# Patient Record
Sex: Female | Born: 1960 | Race: White | Hispanic: No | Marital: Married | Smoking: Never smoker
Health system: Southern US, Community
[De-identification: ages and names within clinical notes are randomized; demographics above are authoritative.]

## PROBLEM LIST (undated history)

## (undated) DIAGNOSIS — J45909 Unspecified asthma, uncomplicated: Secondary | ICD-10-CM

## (undated) DIAGNOSIS — B4481 Allergic bronchopulmonary aspergillosis: Secondary | ICD-10-CM

---

## 2021-03-31 ENCOUNTER — Ambulatory Visit: Admit: 2021-03-31 | Payer: Self-pay

## 2021-03-31 ENCOUNTER — Other Ambulatory Visit: Payer: Self-pay

## 2021-03-31 ENCOUNTER — Encounter: Payer: Self-pay | Admitting: Emergency Medicine

## 2021-03-31 ENCOUNTER — Ambulatory Visit (INDEPENDENT_AMBULATORY_CARE_PROVIDER_SITE_OTHER): Payer: Medicare Other

## 2021-03-31 ENCOUNTER — Ambulatory Visit
Admission: EM | Admit: 2021-03-31 | Discharge: 2021-03-31 | Disposition: A | Discharge status: Home / Self Care | Payer: Medicare Other | Attending: Emergency Medicine | Admitting: Emergency Medicine

## 2021-03-31 DIAGNOSIS — R059 Cough, unspecified: Secondary | ICD-10-CM

## 2021-03-31 DIAGNOSIS — J189 Pneumonia, unspecified organism: Secondary | ICD-10-CM | POA: Diagnosis not present

## 2021-03-31 DIAGNOSIS — R509 Fever, unspecified: Secondary | ICD-10-CM

## 2021-03-31 DIAGNOSIS — R0602 Shortness of breath: Secondary | ICD-10-CM

## 2021-03-31 DIAGNOSIS — Z20822 Contact with and (suspected) exposure to covid-19: Secondary | ICD-10-CM | POA: Diagnosis not present

## 2021-03-31 DIAGNOSIS — J180 Bronchopneumonia, unspecified organism: Secondary | ICD-10-CM | POA: Diagnosis not present

## 2021-03-31 DIAGNOSIS — B4481 Allergic bronchopulmonary aspergillosis: Secondary | ICD-10-CM

## 2021-03-31 DIAGNOSIS — J45909 Unspecified asthma, uncomplicated: Secondary | ICD-10-CM

## 2021-03-31 HISTORY — DX: Unspecified asthma, uncomplicated: J45.909

## 2021-03-31 HISTORY — DX: Allergic bronchopulmonary aspergillosis: B44.81

## 2021-03-31 LAB — RAPID INFLUENZA A&B ANTIGENS
Influenza A (ARMC): NEGATIVE
Influenza B (ARMC): NEGATIVE

## 2021-03-31 MED ORDER — AZITHROMYCIN 250 MG PO TABS: 250.0000 mg | ORAL_TABLET | Freq: Every day | ORAL | 0 refills | Status: DC

## 2021-03-31 MED ORDER — ALBUTEROL SULFATE HFA 108 (90 BASE) MCG/ACT IN AERS: 1.0000 | INHALATION_SPRAY | Freq: Four times a day (QID) | RESPIRATORY_TRACT | 0 refills | Status: AC | PRN

## 2021-03-31 MED ORDER — AMOXICILLIN-POT CLAVULANATE 875-125 MG PO TABS: 1.0000 | ORAL_TABLET | Freq: Two times a day (BID) | ORAL | 0 refills | Status: AC

## 2021-03-31 MED ORDER — PROMETHAZINE-DM 6.25-15 MG/5ML PO SYRP: 5.0000 mL | ORAL_SOLUTION | Freq: Four times a day (QID) | ORAL | 0 refills | Status: DC | PRN

## 2021-03-31 MED ORDER — BENZONATATE 100 MG PO CAPS: 200.0000 mg | ORAL_CAPSULE | Freq: Three times a day (TID) | ORAL | 0 refills | Status: DC

## 2021-03-31 MED ORDER — PREDNISONE 20 MG PO TABS: 60.0000 mg | ORAL_TABLET | Freq: Every day | ORAL | 0 refills | Status: AC

## 2021-03-31 NOTE — Discharge Instructions (Addendum)
Your chest x-ray showed bilateral pneumonia today.  I will treat your pneumonia with 2 separate antibiotics, Augmentin and azithromycin.  Take the Augmentin twice daily with food for 10 days for treatment of your pneumonia.  Take the azithromycin 2 tablets today and then 1 tablet each day thereafter for a total of 5 days of treatment for your pneumonia.  Start the prednisone in the morning at breakfast time.  You will take 3 tablets (60 mg) daily for 5 days.  This will help with lung inflammation and aid in healing.  Use the albuterol inhaler, 1 to 2 puffs every 4-6 hours, as needed for shortness of breath and or wheezing.  Use the Tessalon Perles every 8 hours during the day as needed for cough.  They will call Dr. Cough reflex.  Take them with a small sip of water.  You may experience some numbness to the base of your tongue or metallic taste in her mouth, this is normal.  Use the Promethazine DM cough syrup at bedtime.  This has not antihistamine in it which will help dry you up but will also make you drowsy so it makes it easier for her to sleep.  If you have any worsening of your shortness of breath please return for reevaluation or go to the ER.

## 2021-03-31 NOTE — ED Provider Notes (Signed)
[facility]    CSN: [id number] Arrival date & time: 03/31/21  1126      History   Chief Complaint Chief Complaint  Patient presents with   Cough   Shortness of Breath   Fatigue    HPI Michelle Chang is a 60 y.o. female.   HPI  60 year old female here for evaluation of respiratory complaints.  Patient reports that she has been experiencing a productive cough for yellow-green sputum, fatigue, and shortness of breath for last 6 days.  This is been coupled with a fever with a T-max of 102 and a decreased appetite.  She denies runny nose, nasal congestion, ear pain or pressure, sore throat, or GI complaints.  She is not a smoker.  She is unaware of any sick contacts though she did attend a birthday party with one of her grandchildren the week and before her symptoms started.  Past Medical History:  Diagnosis Date   Allergic bronchopulmonary aspergillosis (HCC)    Asthma     Patient Active Problem List   Diagnosis Date Noted   Allergic bronchopulmonary aspergillosis (HCC) 03/31/2021   Asthma 03/31/2021    History reviewed. No pertinent surgical history.  OB History   No obstetric history on file.      Home Medications    Prior to Admission medications   Medication Sig Start Date End Date Taking? Authorizing Provider  amoxicillin-clavulanate (AUGMENTIN) 875-125 MG tablet Take 1 tablet by mouth every 12 (twelve) hours for 10 days. 03/31/21 04/10/21 Yes Becky Augusta, NP  azithromycin (ZITHROMAX Z-PAK) 250 MG tablet Take 1 tablet (250 mg total) by mouth daily. Take 2 tablets on the first day and then 1 tablet daily thereafter for a total of 5 days of treatment. 03/31/21  Yes Becky Augusta, NP  benzonatate (TESSALON) 100 MG capsule Take 2 capsules (200 mg total) by mouth every 8 (eight) hours. 03/31/21  Yes Becky Augusta, NP  predniSONE (DELTASONE) 20 MG tablet Take 3 tablets (60 mg total) by mouth daily with breakfast for 5 days. 3 tablets daily for 5 days.  03/31/21 04/05/21 Yes Becky Augusta, NP  promethazine-dextromethorphan (PROMETHAZINE-DM) 6.25-15 MG/5ML syrup Take 5 mLs by mouth 4 (four) times daily as needed. 03/31/21  Yes Becky Augusta, NP  albuterol (VENTOLIN HFA) 108 (90 Base) MCG/ACT inhaler Inhale 1-2 puffs into the lungs every 6 (six) hours as needed for wheezing or shortness of breath. 03/31/21   Becky Augusta, NP  buPROPion (WELLBUTRIN XL) 300 MG 24 hr tablet Take by mouth.    [provider]  clonazePAM (KLONOPIN) 0.5 MG tablet Take by mouth.    [provider]    Family History History reviewed. No pertinent family history.  Social History Social History   Tobacco Use   Smoking status: Never   Smokeless tobacco: Never  Substance Use Topics   Alcohol use: Not Currently   Drug use: Not Currently     Allergies   Morphine, Molds & smuts, Gluten meal, and Milk protein   Review of Systems Review of Systems  Constitutional:  Positive for appetite change, fatigue and fever. Negative for activity change.  HENT:  Negative for congestion, ear pain, rhinorrhea and sore throat.   Respiratory:  Positive for cough and shortness of breath. Negative for wheezing.   Gastrointestinal:  Negative for diarrhea, nausea and vomiting.  Skin:  Negative for rash.  Hematological: Negative.   Psychiatric/Behavioral: Negative.      Physical Exam Triage Vital Signs ED Triage Vitals  Enc  Vitals Group     BP 03/31/21 1350 (!) 121/58     Pulse Rate 03/31/21 1350 92     Resp 03/31/21 1350 20     Temp 03/31/21 1350 100 F (37.8 C)     Temp Source 03/31/21 1350 Oral     SpO2 03/31/21 1350 97 %     Weight 03/31/21 1346 130 lb (59 kg)     Height 03/31/21 1346 5\' 5"  (1.651 m)     Head Circumference --      Peak Flow --      Pain Score 03/31/21 1346 0     Pain Loc --      Pain Edu? --      Excl. in [place]? --    No data found.  Updated Vital Signs BP (!) 121/58 (BP Location: Left Arm)   Pulse 92   Temp 100 F (37.8 C)  (Oral)   Resp 20   Ht 5\' 5"  (1.651 m)   Wt 130 lb (59 kg)   SpO2 97%   BMI 21.63 kg/m   Visual Acuity Right Eye Distance:   Left Eye Distance:   Bilateral Distance:    Right Eye Near:   Left Eye Near:    Bilateral Near:     Physical Exam Vitals and nursing note reviewed.  Constitutional:      General: She is not in acute distress.    Appearance: Normal appearance. She is not ill-appearing.  HENT:     Head: Normocephalic and atraumatic.     Right Ear: Tympanic membrane, ear canal and external ear normal. There is no impacted cerumen.     Left Ear: Tympanic membrane, ear canal and external ear normal. There is no impacted cerumen.     Nose: Nose normal. No congestion or rhinorrhea.     Mouth/Throat:     Mouth: Mucous membranes are moist.     Pharynx: Oropharynx is clear. No posterior oropharyngeal erythema.  Cardiovascular:     Rate and Rhythm: Normal rate and regular rhythm.     Pulses: Normal pulses.     Heart sounds: Normal heart sounds. No murmur heard.   No gallop.  Pulmonary:     Effort: Pulmonary effort is normal.     Breath sounds: Normal breath sounds. No wheezing, rhonchi or rales.  Musculoskeletal:     Cervical back: Normal range of motion and neck supple.  Lymphadenopathy:     Cervical: No cervical adenopathy.  Skin:    General: Skin is warm and dry.     Capillary Refill: Capillary refill takes less than 2 seconds.     Findings: No erythema or rash.  Neurological:     General: No focal deficit present.     Mental Status: She is alert and oriented to person, place, and time.  Psychiatric:        Mood and Affect: Mood normal.        Behavior: Behavior normal.        Thought Content: Thought content normal.        Judgment: Judgment normal.     UC Treatments / Results  Labs (all labs ordered are listed, but only abnormal results are displayed) Labs Reviewed  RAPID INFLUENZA A&B ANTIGENS  SARS CORONAVIRUS 2 (TAT 6-24 HRS)     EKG   Radiology DG Chest 2 View  Result Date: 03/31/2021 CLINICAL DATA:  Cough, fever and shortness of breath over the last 6 days. EXAM: CHEST - 2 VIEW  COMPARISON:  None. FINDINGS: Heart size is normal. Mediastinal shadows are normal. There are abnormal patchy parenchymal densities in the mid and lower lungs bilaterally, consistent with patchy bronchopneumonia. No lobar consolidation or collapse. No effusion. No significant bone finding. IMPRESSION: Patchy bilateral bronchopneumonia, without lobar consolidation or collapse. Follow-up to clearing is recommended. Electronically Signed   By: Paulina Fusi M.D.   On: 03/31/2021 14:40    Procedures Procedures (including critical care time)  Medications Ordered in UC Medications - No data to display  Initial Impression / Assessment and Plan / UC Course  I have reviewed the triage vital signs and the nursing notes.  Pertinent labs & imaging results that were available during my care of the patient were reviewed by me and considered in my medical decision making (see chart for details).  Patient is a nontoxic-appearing 60 year old female here for evaluation of lower respiratory complaints as outlined in HPI above.  Patient has no upper respiratory complaints and her upper respiratory exam is benign.  Cardiopulmonary exam reveals decreased lung sounds in the middle and lower aspects bilaterally.  No wheezes, rhonchi, or rales appreciated on exam.  Will obtain chest radiograph to evaluate for possible pneumonia given the purulent sputum patient is expectorating.  Chest x-ray independently reviewed and evaluated by me.  Impression: There is a patchy infiltrate in the right middle lobe and the left hilar area.  Radiology overread is pending. Radiology impression is patchy bilateral bronchopneumonia without lobar consolidation or collapse.  Will treat patient for commune acquired pneumonia with Augmentin and azithromycin.  We will also give  prednisone to help with bronchial inflammation and albuterol inhaler to help with shortness of breath.  Tessalon Perles provided to help with cough as well as some Promethazine DM cough syrup for bedtime.   Final Clinical Impressions(s) / UC Diagnoses   Final diagnoses:  Community acquired pneumonia, unspecified laterality     Discharge Instructions      Your chest x-ray showed bilateral pneumonia today.  I will treat your pneumonia with 2 separate antibiotics, Augmentin and azithromycin.  Take the Augmentin twice daily with food for 10 days for treatment of your pneumonia.  Take the azithromycin 2 tablets today and then 1 tablet each day thereafter for a total of 5 days of treatment for your pneumonia.  Start the prednisone in the morning at breakfast time.  You will take 3 tablets (60 mg) daily for 5 days.  This will help with lung inflammation and aid in healing.  Use the albuterol inhaler, 1 to 2 puffs every 4-6 hours, as needed for shortness of breath and or wheezing.  Use the Tessalon Perles every 8 hours during the day as needed for cough.  They will call Dr. Cough reflex.  Take them with a small sip of water.  You may experience some numbness to the base of your tongue or metallic taste in her mouth, this is normal.  Use the Promethazine DM cough syrup at bedtime.  This has not antihistamine in it which will help dry you up but will also make you drowsy so it makes it easier for her to sleep.  If you have any worsening of your shortness of breath please return for reevaluation or go to the ER.       ED Prescriptions     Medication Sig Dispense Auth. Provider   albuterol (VENTOLIN HFA) 108 (90 Base) MCG/ACT inhaler Inhale 1-2 puffs into the lungs every 6 (six) hours as needed for wheezing  or shortness of breath. 18 g Becky Augusta, NP   amoxicillin-clavulanate (AUGMENTIN) 875-125 MG tablet Take 1 tablet by mouth every 12 (twelve) hours for 10 days. 20 tablet Becky Augusta, NP   azithromycin (ZITHROMAX Z-PAK) 250 MG tablet Take 1 tablet (250 mg total) by mouth daily. Take 2 tablets on the first day and then 1 tablet daily thereafter for a total of 5 days of treatment. 6 tablet Becky Augusta, NP   benzonatate (TESSALON) 100 MG capsule Take 2 capsules (200 mg total) by mouth every 8 (eight) hours. 21 capsule Becky Augusta, NP   predniSONE (DELTASONE) 20 MG tablet Take 3 tablets (60 mg total) by mouth daily with breakfast for 5 days. 3 tablets daily for 5 days. 15 tablet Becky Augusta, NP   promethazine-dextromethorphan (PROMETHAZINE-DM) 6.25-15 MG/5ML syrup Take 5 mLs by mouth 4 (four) times daily as needed. 118 mL Becky Augusta, NP      PDMP not reviewed this encounter.   Becky Augusta, NP 03/31/21 1455

## 2021-03-31 NOTE — ED Triage Notes (Signed)
Pt presents today with c/o of SOB, cough and fatigue x 6 days. She reports running a fever of 102., yesterday 101.0. No OTC meds today.She called Pulmonologist who suggested she be seen for chest xray and testing of Covid and Flu today.

## 2021-04-01 LAB — SARS CORONAVIRUS 2 (TAT 6-24 HRS): SARS Coronavirus 2: NEGATIVE

## 2021-06-23 ENCOUNTER — Ambulatory Visit
Admission: RE | Admit: 2021-06-23 | Discharge: 2021-06-23 | Disposition: A | Discharge status: Home / Self Care | Payer: Medicare PPO | Source: Ambulatory Visit | Attending: Family | Admitting: Family

## 2021-06-23 ENCOUNTER — Other Ambulatory Visit: Payer: Self-pay

## 2021-06-23 ENCOUNTER — Ambulatory Visit (INDEPENDENT_AMBULATORY_CARE_PROVIDER_SITE_OTHER): Payer: Medicare PPO

## 2021-06-23 VITALS — BP 122/73 | HR 70 | Temp 98.6°F | Resp 18 | Ht 65.0 in | Wt 120.0 lb

## 2021-06-23 DIAGNOSIS — R319 Hematuria, unspecified: Secondary | ICD-10-CM | POA: Diagnosis not present

## 2021-06-23 LAB — URINALYSIS, COMPLETE (UACMP) WITH MICROSCOPIC
Bilirubin Urine: NEGATIVE
Glucose, UA: NEGATIVE mg/dL
Leukocytes,Ua: NEGATIVE
Nitrite: NEGATIVE
Protein, ur: NEGATIVE mg/dL
Specific Gravity, Urine: 1.025 (ref 1.005–1.030)
pH: 6 (ref 5.0–8.0)

## 2021-06-23 NOTE — ED Triage Notes (Signed)
Pt here with C/O blood in urine for the past 3 days.

## 2021-06-23 NOTE — ED Provider Notes (Signed)
[facility]    CSN: [id number] Arrival date & time: 06/23/21  1649      History   Chief Complaint Chief Complaint  Patient presents with   Hematuria    HPI Michelle Chang is a 61 y.o. female.   HPI  61 year old female here for evaluation of hematuria.  Patient reports that she first noticed a little bit of pink in her panties 3 days ago.  She noticed more yesterday and also today.  She denies any frank hematuria, clots, pain with urination, urinary urgency or frequency, fever, low back pain, or smoking history.  She does have a history of renal stones.  She did have 1 incident of renal stone being preceded by a finding of hematuria on routine urinalysis.  Past Medical History:  Diagnosis Date   Allergic bronchopulmonary aspergillosis ([place])    Asthma     Patient Active Problem List   Diagnosis Date Noted   Allergic bronchopulmonary aspergillosis ([place]) 03/31/2021   Asthma 03/31/2021    History reviewed. No pertinent surgical history.  OB History   No obstetric history on file.      Home Medications    Prior to Admission medications   Medication Sig Start Date End Date Taking? Authorizing Provider  albuterol (VENTOLIN HFA) 108 (90 Base) MCG/ACT inhaler Inhale 1-2 puffs into the lungs every 6 (six) hours as needed for wheezing or shortness of breath. 03/31/21  Yes Margarette Canada, NP  buPROPion (WELLBUTRIN XL) 300 MG 24 hr tablet Take by mouth.   Yes [provider]  clonazePAM (KLONOPIN) 0.5 MG tablet Take by mouth.   Yes [provider]  Fluticasone-Umeclidin-Vilant (TRELEGY ELLIPTA) 200-62.5-25 MCG/ACT AEPB Trelegy Ellipta 200 mcg-62.5 mcg-25 mcg powder for inhalation  Inhale 1 puff every day by inhalation route.   Yes [provider]  predniSONE (DELTASONE) 10 MG tablet Take 1 tablet by mouth daily. 06/16/21  Yes [provider]    Family History History reviewed. No pertinent family history.  Social History Social  History   Tobacco Use   Smoking status: Never   Smokeless tobacco: Never  Substance Use Topics   Alcohol use: Not Currently   Drug use: Not Currently     Allergies   Morphine, Molds & smuts, Gluten meal, and Milk protein   Review of Systems Review of Systems  Constitutional:  Negative for activity change and fever.  Gastrointestinal:  Negative for abdominal pain.  Genitourinary:  Positive for hematuria. Negative for dysuria, flank pain, frequency and urgency.  Musculoskeletal:  Negative for back pain.  Hematological: Negative.   Psychiatric/Behavioral: Negative.      Physical Exam Triage Vital Signs ED Triage Vitals  Enc Vitals Group     BP 06/23/21 1709 122/73     Pulse Rate 06/23/21 1709 70     Resp 06/23/21 1709 18     Temp 06/23/21 1709 98.6 F (37 C)     Temp Source 06/23/21 1709 Oral     SpO2 06/23/21 1709 98 %     Weight 06/23/21 1707 120 lb (54.4 kg)     Height 06/23/21 1707 5\' 5"  (1.651 m)     Head Circumference --      Peak Flow --      Pain Score 06/23/21 1706 0     Pain Loc --      Pain Edu? --      Excl. in [place]? --    No data found.  Updated Vital Signs BP 122/73 (  BP Location: Right Arm)    Pulse 70    Temp 98.6 F (37 C) (Oral)    Resp 18    Ht 5\' 5"  (1.651 m)    Wt 120 lb (54.4 kg)    SpO2 98%    BMI 19.97 kg/m   Visual Acuity Right Eye Distance:   Left Eye Distance:   Bilateral Distance:    Right Eye Near:   Left Eye Near:    Bilateral Near:     Physical Exam Vitals and nursing note reviewed.  Constitutional:      General: She is not in acute distress.    Appearance: Normal appearance. She is not ill-appearing.  HENT:     Head: Normocephalic and atraumatic.  Cardiovascular:     Rate and Rhythm: Normal rate and regular rhythm.     Pulses: Normal pulses.     Heart sounds: Normal heart sounds. No murmur heard.   No friction rub. No gallop.  Pulmonary:     Effort: Pulmonary effort is normal.     Breath sounds: Normal breath sounds.  No wheezing, rhonchi or rales.  Abdominal:     Tenderness: There is no right CVA tenderness or left CVA tenderness.  Skin:    General: Skin is warm and dry.     Capillary Refill: Capillary refill takes less than 2 seconds.     Findings: No erythema or rash.  Neurological:     General: No focal deficit present.     Mental Status: She is alert and oriented to person, place, and time.  Psychiatric:        Mood and Affect: Mood normal.        Behavior: Behavior normal.        Thought Content: Thought content normal.        Judgment: Judgment normal.     UC Treatments / Results  Labs (all labs ordered are listed, but only abnormal results are displayed) Labs Reviewed  URINALYSIS, COMPLETE (UACMP) WITH MICROSCOPIC - Abnormal; Notable for the following components:      Result Value   Hgb urine dipstick MODERATE (*)    Ketones, ur TRACE (*)    Bacteria, UA FEW (*)    All other components within normal limits    EKG   Radiology DG Abdomen 1 View  Result Date: 06/23/2021 CLINICAL DATA:  Hematuria.  History of renal stones. EXAM: ABDOMEN - 1 VIEW COMPARISON:  None. FINDINGS: No convincing stones project over the renal beds, evaluation is limited due to stool partially obscuring assessment. There is a calcification in the low left pelvis that is typically a phlebolith. No other calcifications along the course of the ureters. Moderate colonic stool burden without evidence of obstruction. Calcified granuloma the left lung base, incidental. The lung bases are otherwise clear. No acute osseous findings. IMPRESSION: 1. No evidence of urolithiasis, with limitations due to stool partially obscuring assessment of the renal beds. 2. A calcification in the low left pelvis is typically a phlebolith. Electronically Signed   By: Keith Rake M.D.   On: 06/23/2021 18:03    Procedures Procedures (including critical care time)  Medications Ordered in UC Medications - No data to display  Initial  Impression / Assessment and Plan / UC Course  I have reviewed the triage vital signs and the nursing notes.  Pertinent labs & imaging results that were available during my care of the patient were reviewed by me and considered in my medical decision making (  see chart for details).  Patient is a nontoxic-appearing 61 year old female here for evaluation of 3 days worth of hematuria without any other associated urinary symptoms.  On physical exam patient has a benign cardiopulmonary exam with clear lung sounds in all fields.  No CVA tenderness on exam.  Patient does have a history of renal stones and her previous presentation included findings of microscopic hematuria on UA a month or so prior to presenting with a stone.  She denies any flank pain, dysuria, urgency, or frequency.  She states that several weeks ago she did feel achy in her back but was able to get sleep.  She was never woke up out of sleep and the achiness resolved on its own.  Hematuria began 3 days ago with the finding of light pink in her panties.  She reports that this is still the finding and she has not seen any gross blood in her urine and she is not passing any clots.  We will check UA and also KUB to look for presence of possible infection or renal stone.  KUB independently reviewed and evaluated by me.  Impression: Patient has a significant stool burden throughout her colon.  No clear evidence of kidney stone noted on exam.  Radiology overread is pending. Radiology impression is no convincing stones project over the renal beds but evaluation is limited due to stool partially obscuring assessment.  There is calcification in the lower left pelvis that is typically a phlebolith.  No calcifications along the course the ureters.  Moderate colonic stool burden without evidence of obstruction.  Urinalysis shows moderate hemoglobin, trace ketones, 6-10 squamous epithelials, and few bacteria.  No leukocyte esterase, nitrites, or protein.  No  WBCs seen.  There is no evidence of infection on urinalysis and there is no evidence of renal stone on KUB.  Etiology of patient's hematuria is unclear.  As patient does not have a PCP secondary to recent relocation of this area will refer to urology for further evaluation.   Final Clinical Impressions(s) / UC Diagnoses   Final diagnoses:  Hematuria, unspecified type     Discharge Instructions      There is no evidence of urinary tract infection but she did have a moderate amount of hemoglobin in your urine.  Your x-ray did not show the presence of any renal stones either though part of the exam was obscured by stool.  I am going to refer you to [facility] urology at their [facility] office for further evaluation of your hematuria since you do not have a local PCP.  Please return for reevaluation for new or worsening symptoms.     ED Prescriptions   None    PDMP not reviewed this encounter.   Margarette Canada, NP 06/23/21 [phone]

## 2021-06-23 NOTE — Discharge Instructions (Addendum)
There is no evidence of urinary tract infection but she did have a moderate amount of hemoglobin in your urine.  Your x-ray did not show the presence of any renal stones either though part of the exam was obscured by stool.  I am going to refer you to [facility] urology at their [facility] office for further evaluation of your hematuria since you do not have a local PCP.  Please return for reevaluation for new or worsening symptoms.

## 2021-07-10 NOTE — Progress Notes (Addendum)
07/13/21 11:12 AM   Konrad Saha November 09, 1960 [id number]  Referring provider:  Margarette Canada, NP [address] [place],  [place] [address] Chief Complaint  Patient presents with   Hematuria     HPI: Michelle Chang is a 61 y.o.female who presents today for further evaluation of gross hematuria.   She was seen in th ED on 06/23/2021 for gross hematuria (pink urine including in underwear).  She did have some mild associated RLQ pain which as since resolved, non severe.  Urinalysis showed moderate Hgb, trace ketones, and few bacteria. KUB visualized no evidence of urolithiasis. It showed stool obscuring assessment of renal beds and a calcification in the low left pelvis. She was further referred to urology.   Urinalysis on 06/23/2021 does fact show 6-10 red blood cells per high-power field.  She does report a personal history of kidney stones a few years ago in [place].  She reports that she had a very small stone in her ureter but never saw passed.  She reports that before moving here in October, she had microscopic blood in her urine identified by her PCP was supposed to be evaluated for this however ended up moving to [place].  She does have a strong family history of renal cell carcinoma including 2 and 6 who died of this.  She denies any flank pain today.  She reports that she has had a very tough time since moving to [place] and may end up moving back to [place], her wife is in the process of moving back now.  There is a lot of social issues contributing to this.  Never smoker.   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  Home Medications:  Allergies as of 07/11/2021       Reactions   Morphine Hives   Molds & Smuts    Other reaction(s): Chest Pain   Gluten Meal    Milk Protein         Medication List        Accurate as of  July 11, 2021 11:59 PM. If you have any questions, ask your nurse or doctor.          albuterol 108 (90 Base) MCG/ACT inhaler Commonly known as: VENTOLIN HFA Inhale 1-2 puffs into the lungs every 6 (six) hours as needed for wheezing or shortness of breath.   Albuterol Sulfate (sensor) 108 (90 Base) MCG/ACT Aepb albuterol sulf 90 mcg/actuation breath activated powder inhaler,sensor   ALPRAZolam 0.5 MG tablet Commonly known as: XANAX alprazolam 0.5 mg tablet  TAKE 1 TABLET BY MOUTH 4 TIMES DAILY PRN   buPROPion 300 MG 24 hr tablet Commonly known as: WELLBUTRIN XL Take by mouth.   clonazePAM 0.5 MG tablet Commonly known as: KLONOPIN Take by mouth.   prednisoLONE 5 MG Tabs tablet Take 5 mg by mouth daily.   predniSONE 10 MG tablet Commonly known as: DELTASONE Take 1 tablet by mouth as needed (as needed for flare).   Trelegy Ellipta 200-62.5-25 MCG/ACT Aepb Generic drug: Fluticasone-Umeclidin-Vilant Trelegy Ellipta 200 mcg-62.5 mcg-25 mcg powder for inhalation  Inhale 1 puff every day by inhalation route.        Allergies:  Allergies  Allergen Reactions   Morphine Hives   Molds & Smuts     Other reaction(s): Chest Pain   Gluten Meal    Milk Protein  Family History: No family history on file.  Social History:  reports that she has never smoked. She has never used smokeless tobacco. She reports that she does not currently use alcohol. She reports that she does not currently use drugs.   Physical Exam: BP 99/62 (BP Location: Left Arm, Patient Position: Sitting, Cuff Size: Normal)    Pulse 83    Ht 5\' 5"  (1.651 m)    Wt 129 lb (58.5 kg)    BMI 21.47 kg/m   Constitutional:  Alert and oriented, No acute distress. HEENT: [place] AT, moist mucus membranes.  Trachea midline, no masses. Cardiovascular: No clubbing, cyanosis, or edema. Respiratory: Normal respiratory effort, no increased work of breathing. Skin: No rashes, bruises or suspicious lesions. Neurologic:  Grossly intact, no focal deficits, moving all 4 extremities. Psychiatric: Normal mood and affect.   Urinalysis Results for orders placed or performed during the hospital encounter of 07/11/21  Urinalysis, Complete w Microscopic  Result Value Ref Range   Color, Urine YELLOW YELLOW   APPearance CLEAR CLEAR   Specific Gravity, Urine 1.010 1.005 - 1.030   pH 6.5 5.0 - 8.0   Glucose, UA NEGATIVE NEGATIVE mg/dL   Hgb urine dipstick TRACE (A) NEGATIVE   Bilirubin Urine NEGATIVE NEGATIVE   Ketones, ur NEGATIVE NEGATIVE mg/dL   Protein, ur NEGATIVE NEGATIVE mg/dL   Nitrite NEGATIVE NEGATIVE   Leukocytes,Ua NEGATIVE NEGATIVE   Squamous Epithelial / LPF 0-5 0 - 5   WBC, UA 6-10 0 - 5 WBC/hpf   RBC / HPF 0-5 0 - 5 RBC/hpf   Bacteria, UA FEW (A) NONE SEEN     Pertinent Imaging: CLINICAL DATA:  Hematuria.  History of renal stones.   EXAM: ABDOMEN - 1 VIEW   COMPARISON:  None.   FINDINGS: No convincing stones project over the renal beds, evaluation is limited due to stool partially obscuring assessment. There is a calcification in the low left pelvis that is typically a phlebolith. No other calcifications along the course of the ureters. Moderate colonic stool burden without evidence of obstruction. Calcified granuloma the left lung base, incidental. The lung bases are otherwise clear. No acute osseous findings.   IMPRESSION: 1. No evidence of urolithiasis, with limitations due to stool partially obscuring assessment of the renal beds. 2. A calcification in the low left pelvis is typically a phlebolith.     Electronically Signed   By: Keith Rake M.D.   On: 06/23/2021 18:03  KUB was personally reviewed, no obvious stone burden.  Assessment & Plan:    1. Gross hematuria Based on her history of ongoing persistent microscopic as well as more recent episode of gross hematuria, I do think it is prudent to proceed with full hematuria evaluation including CT urogram and  cystoscopy at this point in time.  She also has strong family history of RCC.  We discussed the risk and benefits.  She is agreeable with proceeding. - CT HEMATURIA WORKUP; Future  2. History of kidney stones We will evaluate with the above, not appreciated on KUB  Follow-up cystoscopy/CT urogram  Conley Rolls as a scribe for Hollice Espy, MD.,have documented all relevant documentation on the behalf of Hollice Espy, MD,as directed by  Hollice Espy, MD while in the presence of Hollice Espy, MD.  I have reviewed the above documentation for accuracy and completeness, and I agree with the above.   Hollice Espy, MD   [facility] [address] [place], [place] [address] [phone]  [phone]  I spent 46 total minutes on the day of the encounter including pre-visit review of the medical record, face-to-face time with the patient, and post visit ordering of labs/imaging/tests.  Records from care everywhere [place] also reviewed.

## 2021-07-11 ENCOUNTER — Other Ambulatory Visit: Payer: Self-pay

## 2021-07-11 ENCOUNTER — Encounter: Payer: Self-pay | Admitting: Urology

## 2021-07-11 ENCOUNTER — Other Ambulatory Visit
Admission: RE | Admit: 2021-07-11 | Discharge: 2021-07-11 | Disposition: A | Discharge status: Home / Self Care | Payer: Medicare PPO | Attending: Urology | Admitting: Urology

## 2021-07-11 ENCOUNTER — Ambulatory Visit: Payer: Medicare PPO | Admitting: Urology

## 2021-07-11 ENCOUNTER — Other Ambulatory Visit: Payer: Self-pay | Admitting: *Deleted

## 2021-07-11 VITALS — BP 99/62 | HR 83 | Ht 65.0 in | Wt 129.0 lb

## 2021-07-11 DIAGNOSIS — R310 Gross hematuria: Secondary | ICD-10-CM

## 2021-07-11 DIAGNOSIS — Z87442 Personal history of urinary calculi: Secondary | ICD-10-CM | POA: Diagnosis not present

## 2021-07-11 LAB — URINALYSIS, COMPLETE (UACMP) WITH MICROSCOPIC
Bilirubin Urine: NEGATIVE
Glucose, UA: NEGATIVE mg/dL
Ketones, ur: NEGATIVE mg/dL
Leukocytes,Ua: NEGATIVE
Nitrite: NEGATIVE
Protein, ur: NEGATIVE mg/dL
Specific Gravity, Urine: 1.01 (ref 1.005–1.030)
pH: 6.5 (ref 5.0–8.0)

## 2021-07-25 ENCOUNTER — Other Ambulatory Visit: Payer: Medicare PPO | Admitting: Urology

## 2021-08-04 ENCOUNTER — Other Ambulatory Visit: Payer: Self-pay

## 2021-08-04 ENCOUNTER — Ambulatory Visit
Admission: RE | Admit: 2021-08-04 | Discharge: 2021-08-04 | Disposition: A | Discharge status: Home / Self Care | Payer: Medicare PPO | Source: Ambulatory Visit | Attending: Urology | Admitting: Urology

## 2021-08-04 DIAGNOSIS — R310 Gross hematuria: Secondary | ICD-10-CM | POA: Diagnosis present

## 2021-08-04 MED ORDER — IOHEXOL 300 MG/ML  SOLN
100.0000 mL | Freq: Once | INTRAMUSCULAR | Status: AC | PRN
Start: 2021-08-04 — End: 2021-08-04
  Administered 2021-08-04: 100 mL via INTRAVENOUS

## 2021-08-07 NOTE — Progress Notes (Signed)
? ?  08/08/21 ? ?CC:  ?Chief Complaint  ?Patient presents with  ? Cysto  ? ? ? ?HPI: ?Michelle Chang is a 61 y.o.female with a personal history of gross hematuria and kidney stones, who presents today for a diagnostic cystoscopy.  ? ?She was seen in th ED on 06/23/2021 for gross hematuria (pink urine including in underwear).  She did have some mild associated RLQ pain which as since resolved, non severe.  Urinalysis showed moderate Hgb, trace ketones, and few bacteria. KUB visualized no evidence of urolithiasis. It showed stool obscuring assessment of renal beds and a calcification in the low left pelvis. She was further referred to urology.  ?  ?Urinalysis on 06/23/2021 does fact show 6-10 red blood cells per high-power field.  ? ?She does have a strong family history of renal cell carcinoma including 2 and 6 who died of this.  ? ?She underwent a CT urogram on 08/04/2021 that was personally reviewed today and visualized no hydronephrosis.  No renal, ureteral or bladder calculi. No solid enhancing renal mass. Lingular and right middle lobe mucoid impaction with tree-in-bud opacities and 5 mm pulmonary nodule in the right middle lobe, most consistent with an infectious or inflammatory etiology including non TB Mycobacterium ? ?Urinalysis today was negative for hematuria.  ? ?Vitals:  ? 08/08/21 1524  ?BP: 115/69  ?Pulse: 66  ?NED. A&Ox3.   ?No respiratory distress   ?Abd soft, NT, ND ?Normal external genitalia with patent urethral meatus ? ?Cystoscopy Procedure Note ? ?Patient identification was confirmed, informed consent was obtained, and patient was prepped using Betadine solution.  Lidocaine jelly was administered per urethral meatus.   ? ?Procedure: ?- Flexible cystoscope introduced, without any difficulty.   ?- Thorough search of the bladder revealed: ?   normal urethral meatus ?   normal urothelium ?   no stones ?   no ulcers  ?   no tumors ?   no urethral polyps ?   no trabeculation ? ?- Ureteral orifices were  normal in position and appearance. ? ?Post-Procedure: ?- Patient tolerated the procedure well ? ?Assessment/ Plan: ?Gross Hematuria  ?- Cystoscopy unremarkable today  ?- UA today negative for hematuria ?- Discussed with her that if she has increased hematuria that she should follow-up or in 2 years if microscopic hematuria  persists ? ?2. Pulmonary nodule  ?- Advise her to follow-up with her pulmonologist, known issue and has appt in April ? ? ?I,Kailey Littlejohn,acting as a Education administrator for Hollice Espy, MD.,have documented all relevant documentation on the behalf of Hollice Espy, MD,as directed by  Hollice Espy, MD while in the presence of Hollice Espy, MD. ? ?I have reviewed the above documentation for accuracy and completeness, and I agree with the above.  ? ?Hollice Espy, MD ? ?

## 2021-08-08 ENCOUNTER — Other Ambulatory Visit: Payer: Self-pay

## 2021-08-08 ENCOUNTER — Other Ambulatory Visit
Admission: RE | Admit: 2021-08-08 | Discharge: 2021-08-08 | Disposition: A | Discharge status: Home / Self Care | Payer: Medicare PPO | Attending: Urology | Admitting: Urology

## 2021-08-08 ENCOUNTER — Encounter: Payer: Self-pay | Admitting: Urology

## 2021-08-08 ENCOUNTER — Ambulatory Visit: Payer: Medicare PPO | Admitting: Urology

## 2021-08-08 ENCOUNTER — Other Ambulatory Visit: Payer: Medicare PPO | Admitting: Urology

## 2021-08-08 ENCOUNTER — Other Ambulatory Visit: Payer: Self-pay | Admitting: *Deleted

## 2021-08-08 VITALS — BP 115/69 | HR 66 | Ht 65.0 in | Wt 129.0 lb

## 2021-08-08 DIAGNOSIS — R310 Gross hematuria: Secondary | ICD-10-CM

## 2021-08-08 LAB — URINALYSIS, COMPLETE (UACMP) WITH MICROSCOPIC
Bilirubin Urine: NEGATIVE
Glucose, UA: NEGATIVE mg/dL
Ketones, ur: NEGATIVE mg/dL
Leukocytes,Ua: NEGATIVE
Nitrite: NEGATIVE
Protein, ur: NEGATIVE mg/dL
Specific Gravity, Urine: 1.015 (ref 1.005–1.030)
pH: 6 (ref 5.0–8.0)

## 2023-05-02 DEATH — deceased

## 2023-05-23 IMAGING — CT CT ABD-PEL WO/W CM
3 of 12 series · 11 of 46 positions shown, 17 images · IV contrast (APPLIED)
Comparison: None.

CLINICAL DATA: Gross hematuria.  Total hysterectomy 4881.

EXAM:
CT ABDOMEN AND PELVIS WITHOUT AND WITH CONTRAST
TECHNIQUE: Multidetector CT imaging of the abdomen and pelvis was performed
following the standard protocol before and following the bolus
administration of intravenous contrast.

[Series 2: axial pre · axial · non-contrast · 0.68mm/px · z∈[-928,-863]mm · 2 of 91 slices shown]
[im 13/91  soft-tissue]
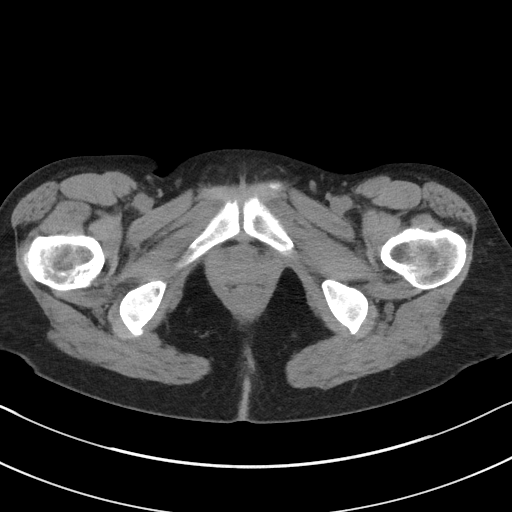
[im 26/91  soft-tissue]
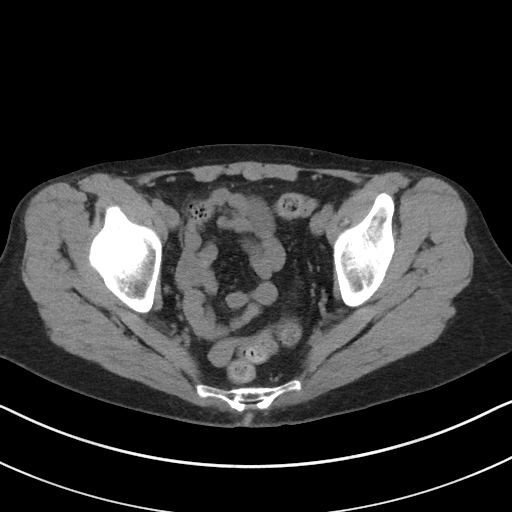

[Series 5: axial post · axial · 0.68mm/px · z∈[-933,-598]mm · 7 of 91 slices shown, 12 images]
[im 12/91  soft-tissue]
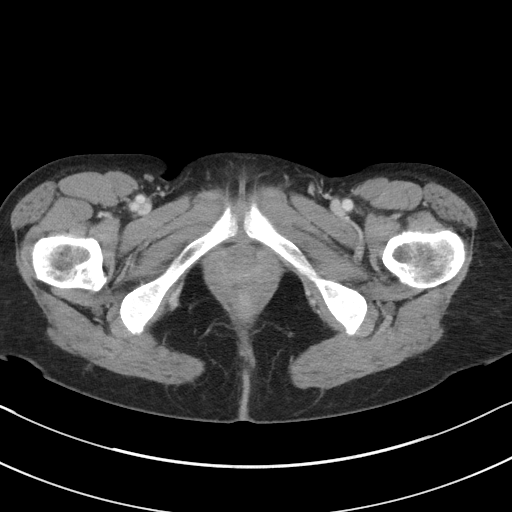
[im 12/91  bone]
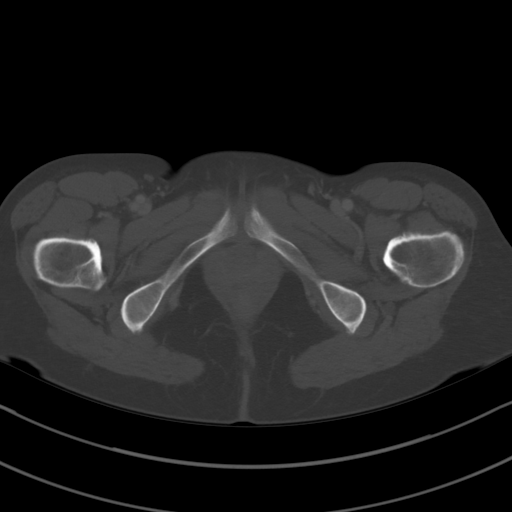
[im 23/91  soft-tissue]
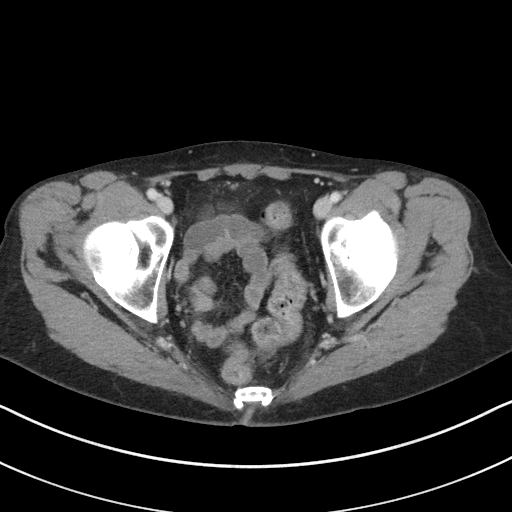
[im 34/91  soft-tissue]
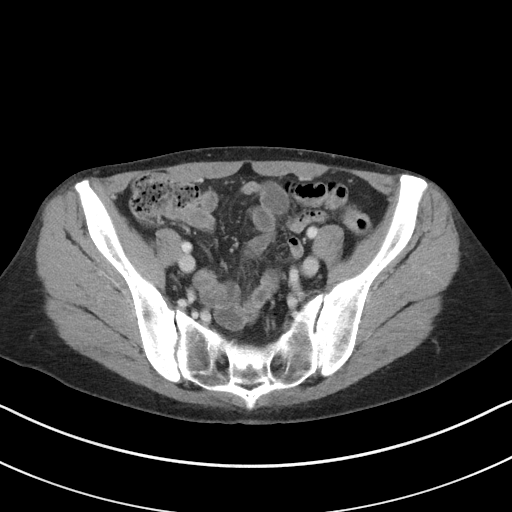
[im 46/91  soft-tissue]
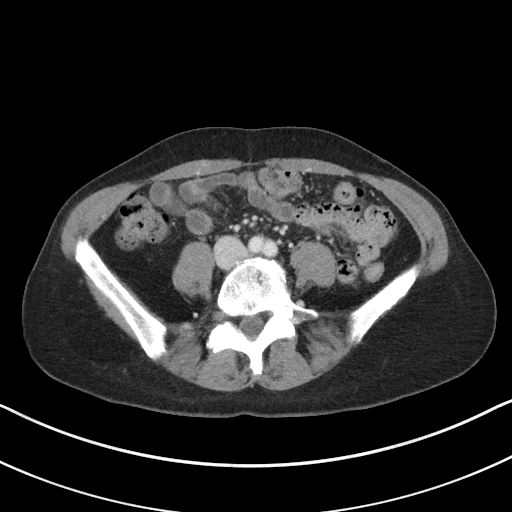
[im 46/91  lung]
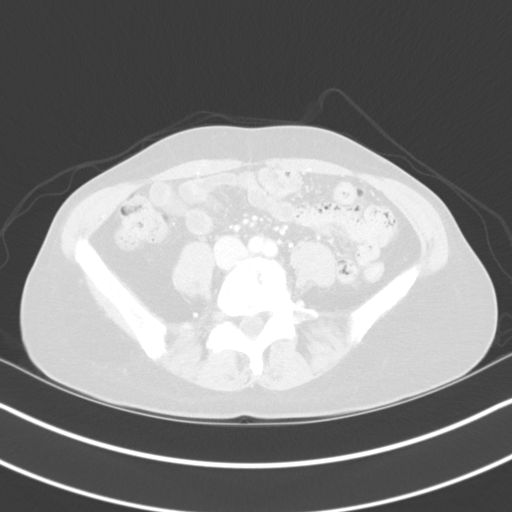
[im 57/91  soft-tissue]
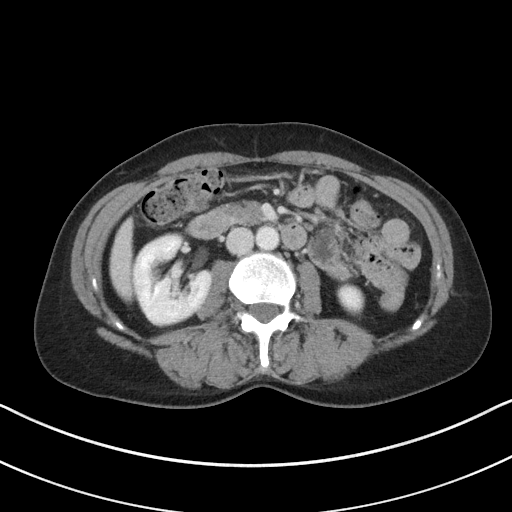
[im 57/91  lung]
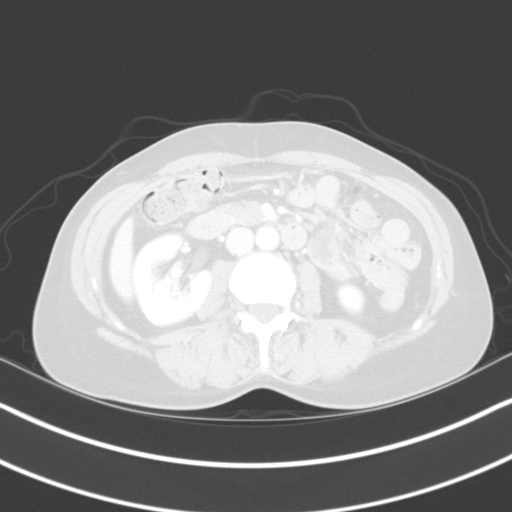
[im 68/91  soft-tissue]
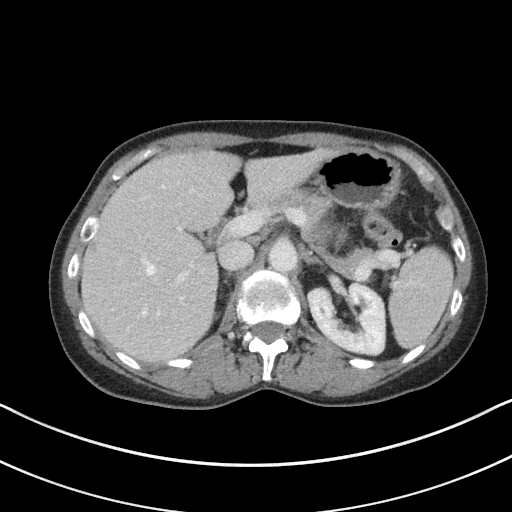
[im 68/91  lung]
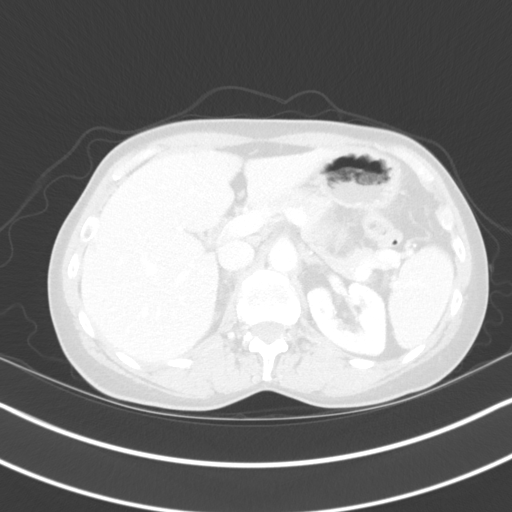
[im 79/91  soft-tissue]
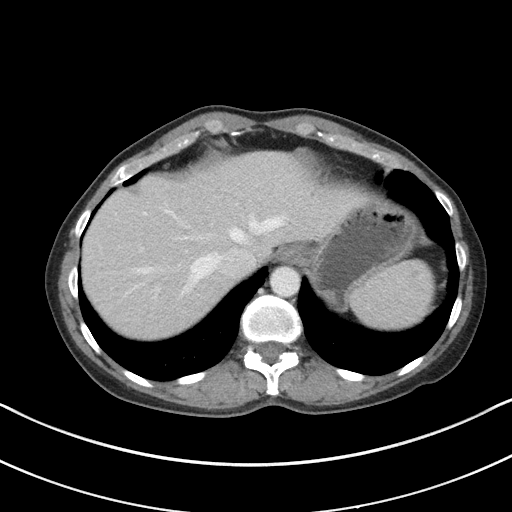
[im 79/91  lung]
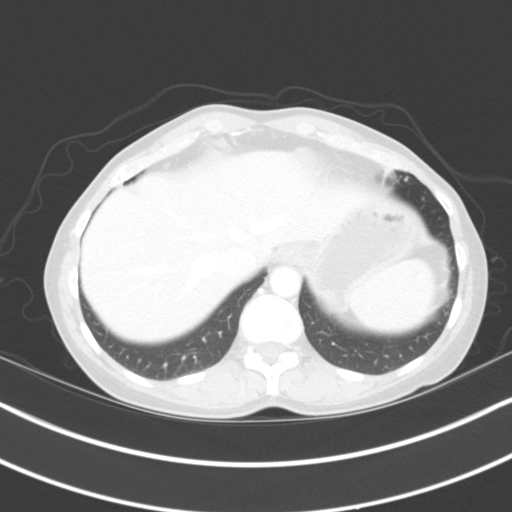

[Series 16: coronal delay · coronal · delayed · 0.60mm/px · 2 of 83 slices shown, 3 images]
[im 28/83  soft-tissue]
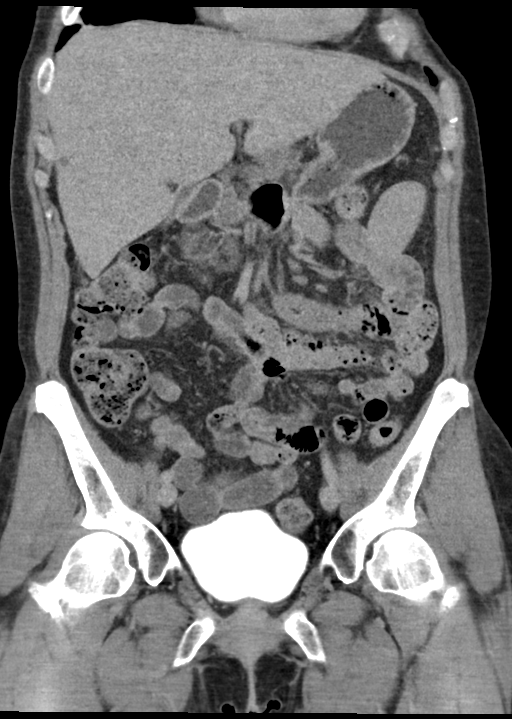
[im 28/83  bone]
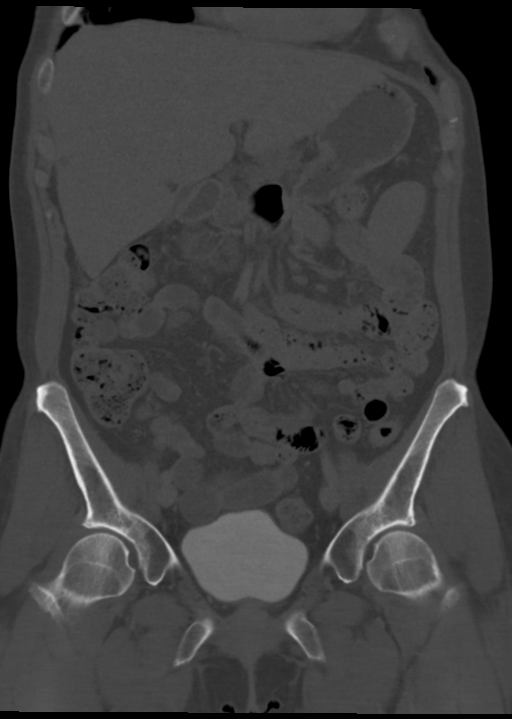
[im 55/83  soft-tissue]
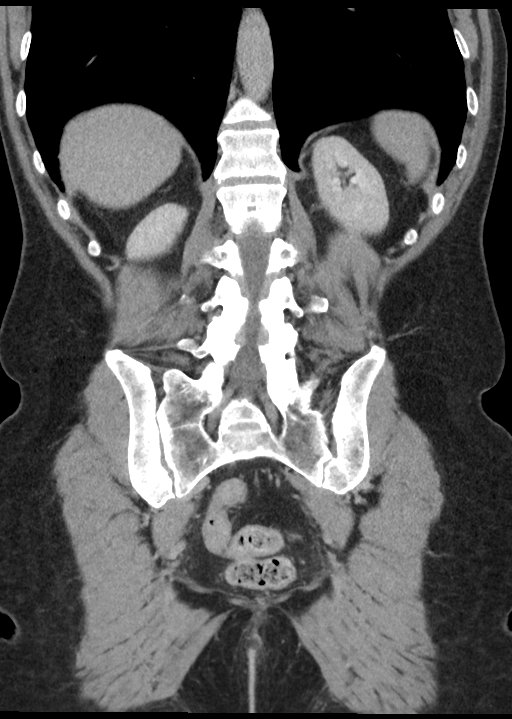

[11 of 46 positions shown; findings below may reference images not displayed]

RADIATION DOSE REDUCTION: This exam was performed according to the
departmental dose-optimization program which includes automated
exposure control, adjustment of the mA and/or kV according to
patient size and/or use of iterative reconstruction technique.

CONTRAST:  100mL OMNIPAQUE IOHEXOL 300 MG/ML  SOLN
FINDINGS: Lower chest: Mucoid impaction with tree-in-bud opacities and 5 mm
pulmonary nodule in the right middle lobe, most consistent with an
infectious or inflammatory etiology including non TB mycobacterium.

Hepatobiliary: 1 cm cyst in the posterior right lobe of the liver.
Additional bilobar scattered hypodense hepatic lesions are
technically too small to accurately characterize but statistically
likely reflect cysts or hemangiomas. Gallbladder is unremarkable. No
biliary ductal dilation.

Pancreas: No pancreatic ductal dilation or evidence of acute
inflammation.

Spleen: No splenomegaly or focal splenic lesion.

Adrenals/Urinary Tract: Bilateral adrenal glands appear normal.

No hydronephrosis. No renal, ureteral or bladder calculi identified.

No solid enhancing renal mass. Bilateral tiny hypodense renal
lesions are technically too small to accurately characterize.

Kidneys demonstrate symmetric enhancement and excretion of contrast
material. No collecting system duplication. No suspicious filling
defect visualized within the opacified portions of the collecting
systems or ureters on delayed imaging.

Urinary bladder is unremarkable for degree of distension without
abnormal wall thickening or suspicious intraluminal filling defect.

Stomach/Bowel: No enteric contrast was administered. Stomach is
unremarkable for degree of distension. No pathologic dilation of
small or large bowel. Terminal ileum appears normal. Appendix is not
confidently identified however there is no pericecal inflammation.
No evidence of acute bowel inflammation.

Vascular/Lymphatic: Normal caliber abdominal aorta. No
pathologically enlarged abdominal or pelvic lymph nodes.

Reproductive: Uterus is surgically absent without enhancing soft
tissue nodularity along the vaginal cuff. No suspicious adnexal
mass.

Other: No significant abdominopelvic free fluid.

Musculoskeletal: Mild lower lumbar predominant degenerative change.
No acute osseous finding.
IMPRESSION: 1. No hydronephrosis.  No renal, ureteral or bladder calculi.
2. No solid enhancing renal mass.
3. Lingular and right middle lobe mucoid impaction with tree-in-bud
opacities and 5 mm pulmonary nodule in the right middle lobe, most
consistent with an infectious or inflammatory etiology including non
TB Mycobacterium.
